# Patient Record
Sex: Female | Born: 1955 | Race: White | Hispanic: No | Marital: Married | Smoking: Never smoker
Health system: Southern US, Community
[De-identification: ages and names within clinical notes are randomized; demographics above are authoritative.]

## PROBLEM LIST (undated history)

## (undated) DIAGNOSIS — K8050 Calculus of bile duct without cholangitis or cholecystitis without obstruction: Secondary | ICD-10-CM

## (undated) DIAGNOSIS — K851 Biliary acute pancreatitis without necrosis or infection: Secondary | ICD-10-CM

## (undated) DIAGNOSIS — R0789 Other chest pain: Secondary | ICD-10-CM

## (undated) HISTORY — DX: Other chest pain: R07.89

## (undated) HISTORY — DX: Biliary acute pancreatitis without necrosis or infection: K85.10

## (undated) HISTORY — DX: Calculus of bile duct without cholangitis or cholecystitis without obstruction: K80.50

## (undated) HISTORY — PX: APPENDECTOMY: SHX54

---

## 2008-04-17 ENCOUNTER — Encounter: Admission: RE | Admit: 2008-04-17 | Discharge: 2008-04-17 | Payer: Self-pay | Admitting: Gastroenterology

## 2008-08-24 ENCOUNTER — Inpatient Hospital Stay (HOSPITAL_COMMUNITY): Admission: EM | Admit: 2008-08-24 | Discharge: 2008-08-28 | Payer: Self-pay | Admitting: Emergency Medicine

## 2008-08-26 ENCOUNTER — Encounter (INDEPENDENT_AMBULATORY_CARE_PROVIDER_SITE_OTHER): Payer: Self-pay | Admitting: General Surgery

## 2008-08-26 HISTORY — PX: LAPAROSCOPIC CHOLECYSTECTOMY W/ CHOLANGIOGRAPHY: SUR757

## 2011-05-11 NOTE — Assessment & Plan Note (Signed)
[facility]                                 ON-CALL NOTE   NAME:Meldrum, Karon                           MRN:          [id number]  DATE:08/24/2008                            DOB:          Oct 08, 1956    TELEPHONE NUMBER:  [phone].   PHYSICIAN:  Dr. Charna Elizabeth   Mrs. Creighton called the answering service at 6:30 am reporting presistent,  severe right upper quadrant abdominal pain associated with nausea and 1  episode of vomiting beginning 6 hours ago.  She has a history of  gallstones and has had mild symptoms in the past.  Over the past week,  she has had worsening symptoms: more frequent, more painful and more  prolonged attacks.  She has had the most severe attack of pain that she  has ever had that began about 12:30 am.  I advised her to have her  husband bring her to the emergency room and to not have anything to eat  or drink.  She will be seen in [facility] Emergency Room for  further evaluation and management.  The patient is agreeable with this  plan.     Venita Lick. Russella Dar, MD, [facility]  Electronically Signed    MTS/MedQ  DD: 08/24/2008  DT: 08/24/2008  Job #: [id number]   cc:   Anselmo Rod, M.D.

## 2011-05-11 NOTE — Discharge Summary (Signed)
Ann Walker, Ann Walker                  ACCOUNT NO.:  [id number]   MEDICAL RECORD NO.:  [id number]          PATIENT TYPE:  INP   LOCATION:  5158                         FACILITY:  [facility]   PHYSICIAN:  Adolph Pollack, M.D.DATE OF BIRTH:  10-15-1956   DATE OF ADMISSION:  08/24/2008  DATE OF DISCHARGE:  08/28/2008                               DISCHARGE SUMMARY   ADMITTING PHYSICIAN:  Juanetta Gosling, MD   DISCHARGING PHYSICIAN:  Adolph Pollack, MD   PROCEDURES:  1. Laparoscopic cholecystectomy with intraoperative cholangiogram done      on August 26, 2008, by Dr. Abbey Chatters.  2. Endoscopic retrograde cholangiopancreatography by Dr. Bosie Clos.   CONSULTANT:  Shirley Friar, MD, with [facility].   REASON FOR ADMISSION:  Ann Walker is a 55 year old female who presented  with a chief complaint of abdominal pain on August 24, 2008.  She was  diagnosed with gallstones this past April but decided to try to treat  this with her diet.  However, this did fail and last week, she began to  have an increasing abdominal pain.  At this time, she called our  practice and was set up for surgery for the middle of September;  however, she presented to the emergency department today with persistent  abdominal pain radiating to her back.  At this time, she was admitted to  go ahead and proceed with a laparoscopic cholecystectomy.  Please see  admitting history and physical for further details.   ADMITTING DIAGNOSES:  1. Choledocholithiasis.  2. Gallstone pancreatitis.   HOSPITAL COURSE:  Upon presentation to the emergency department, the  patient's lipase was found to be 3899 with all other liver function  elevated as well.  Total bilirubin at this time was 2.2.  At this time,  the patient was admitted for gallstone pancreatitis and was kept n.p.o.  for the next couple of days until all of her lab values began to  decrease prior to removing her gallbladder.  By hospital day #2, the  patient's lipase had decreased to 78, as well as all of her other LFTs  were trending down as well.  Therefore at this time, the patient was  taken to the operating room where a laparoscopic cholecystectomy with  intraoperative cholangiogram was performed.  While in this procedure and  having the IOC done, it was found that the patient had more gallstones  in her common bile duct.  Therefore after the procedure, [facility] was called, and on postoperative day #1, the patient  had an ERCP performed, we then removed multiple gallstones, and did a  sphincterotomy.  By postoperative day 2, the patient was feeling better  with minimal pain and at this time, tolerating a diet.  A drain was  placed during the laparoscopic cholecystectomy.  At this time, the only  thing that remains in this drain is serosanguineous fluid and no  evidence of bilious fluid.  Therefore at this time before the patient  was discharged, her JP drain was removed.   DISCHARGE DIAGNOSES:  1. Choledocholithiasis.  2. Biliary pancreatitis.  3. Status post laparoscopic cholecystectomy with intraoperative      cholangiogram.  4. Status post endoscopic retrograde cholangiopancreatography.   DISCHARGE MEDICATIONS:  The patient was not on any home medications;  however, she has been given a prescription for Vicodin 5/325 one to two  tablets q.4 h. p.r.n. for pain.  She is also informed that she may take  a laxative of choice if needed.   DISCHARGE INSTRUCTIONS:  The patient is informed that she is not to do  any heavy lifting for the next 2 weeks of anything greater than  approximately 10-15 pounds to prevent incisional hernia.  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  The patient is informed at  this time that she may increase her activity slowly and may walk up  steps.  She is also informed that she may shower; however,  she is not to  bathe for the next 2 weeks.  Otherwise, the patient is informed that if  she begins to get a fever greater than 101.5, new or worsening abdominal  pain or redness or pus-like drainage coming from her incision, she is to  call our office to be seen before her appointment.  Otherwise, she does  have a followup appointment with [facility] on September 10, 2008, at  2:45 p.m. and she is informed that she needs to arrive at 2:30.      Ann Cape, PA      Adolph Pollack, M.D.  Electronically Signed    KEO/MEDQ  D:  08/28/2008  T:  08/28/2008  Job:  [id number]   cc:   Shirley Friar, MD

## 2011-05-11 NOTE — Consult Note (Signed)
Ann Walker, Ann Walker                  ACCOUNT NO.:  [id number]   MEDICAL RECORD NO.:  [id number]          PATIENT TYPE:  INP   LOCATION:  5158                         FACILITY:  [facility]   PHYSICIAN:  Shirley Friar, MDDATE OF BIRTH:  [birth year]   DATE OF CONSULTATION:  DATE OF DISCHARGE:                                 CONSULTATION   REQUESTING PHYSICIAN:  Dione Booze, MD   REASON FOR CONSULTATION:  Gallstone pancreatitis.   HISTORY OF PRESENT ILLNESS:  Ann Walker is a 55 year old white female  previously unassigned to our group was being seen at the request of Dr.  Preston Fleeting and Dr. Dwain Sarna.  Ann Walker has known gallstones seen on  ultrasound in April 2009, who presents with 1 week of postprandial right  upper quadrant pain.  She had an episode last night that was described  as sharp, constant, lasting for over 6 hours in the right upper  quadrant.  She had episode of vomiting in the emergency room.  She  denies any fevers or chills.  She was afebrile on presentation with  normal white blood count of 7.6.  Her lipase was noted to be 3899, T-  bili 2.2, ALP 242, AST 641, and ALT 498.  Ultrasound preliminary showed  no acute cholecystitis and no CBD dilation, did show gallstones and  fatty liver.   PAST MEDICAL HISTORY:  Negative except as stated above.   MEDICATIONS:  None.   ALLERGIES:  No known drug allergies.   FAMILY HISTORY:  Noncontributory.   SOCIAL HISTORY:  Denies cigarette, alcohol, or drugs.   REVIEW OF SYSTEMS:  Negative for upper GI standpoint except as stated  above.   PHYSICAL EXAMINATION:  VITAL SIGNS:  Temperature 98.1, pulse 80, blood  pressure 144/79, and O2 sat 98% on room air.  GENERAL:  Alert, obese.  No acute distress.  ABDOMEN:  Right upper quadrant tenderness with minimal guarding, soft,  nondistended, positive bowel sounds.   LABORATORY DATA:  White blood count 7.6, hemoglobin 13.5, and platelet  count 185.  Lipase 3899, T-bili 2.2, ALP 242, AST  641, ALT 498, and  albumin 3.8.   IMPRESSION:  A 55 year old white female with gallstone pancreatitis  likely due to passage of a stone versus possible small retained stone.  There is no ultrasound evidence of common bile duct obstruction.  There  is no acute indication for endoscopic retrograde  cholangiopancreatography.  I agree with n.p.o. status for bowel rest and  IV fluids.  We will need to follow her labs closely and if her liver  function test did not improve then she may need to  have a preop endoscopic retrograde cholangiopancreatography.  Otherwise,  we would likely recommend cholecystectomy with intraoperative  cholangiogram.  We will follow along closely with you.  Thank for this  consultation.      Shirley Friar, MD  Electronically Signed     VCS/MEDQ  D:  08/24/2008  T:  08/25/2008  Job:  [id number]   cc:   Juanetta Gosling, MD

## 2011-05-11 NOTE — Op Note (Signed)
Ann Walker, Ann Walker                  ACCOUNT NO.:  [id number]   MEDICAL RECORD NO.:  [id number]          PATIENT TYPE:  INP   LOCATION:  5158                         FACILITY:  [facility]   PHYSICIAN:  John C. Madilyn Fireman, M.D.    DATE OF BIRTH:  11/05/1956   DATE OF PROCEDURE:  08/27/2008  DATE OF DISCHARGE:                               OPERATIVE REPORT   PROCEDURE:  Endoscopic retrograde cholangiopancreatography with  sphincterotomy and stone extraction.   INDICATIONS FOR PROCEDURE:  Common bile duct stones on intraoperative  cholangiogram.   DESCRIPTION OF PROCEDURE:  The patient was placed in the prone position  and placed on the pulse monitor with continuous low-flow oxygen  delivered by nasal cannula.  She was sedated with 75 mcg IV fentanyl and  6 mg IV Versed, and 12.5 mg of Phenergan.  The Olympus side-viewing  endoscope was advanced blindly into the oropharynx and esophagus.  The  pylorus was traversed and the papilla of Vater located on the medial  duodenal wall, had a generous intramural segment.  Deep cannulation was  achieved with the Wilson-Cook sphincterotome and a cholangiogram was  obtained.  This showed multiple filling defects.  No pancreatogram was  obtained.  The guidewire was advanced into the common hepatic duct and a  large sphincterotomy was made.  Then using an adjustable balloon  catheter to 18 mm, 5 multifaceted stones were removed on several sweeps,  each approximately 4-5 mm in diameter.  After the last stone was  delivered, an occlusion cholangiogram revealed no further filling  defects.  The scope was then withdrawn, and there was good drainage at  the end of the procedure.  The patient was returned to the recovery room  in stable condition.  She tolerated the procedure well and there were no  immediate complications.   IMPRESSION:  Five common bile duct stones removed after sphincterotomy.   PLAN:  Observe overnight for complications and possibly discharge  tomorrow.           ______________________________  Everardo All Madilyn Fireman, M.D.     JCH/MEDQ  D:  08/27/2008  T:  08/28/2008  Job:  [id number]   cc:   Adolph Pollack, M.D.

## 2011-05-11 NOTE — Op Note (Signed)
NAMEAALIYAN, BRINKMEIER                  ACCOUNT NO.:  [id number]   MEDICAL RECORD NO.:  [id number]          PATIENT TYPE:  INP   LOCATION:  5158                         FACILITY:  [facility]   PHYSICIAN:  Adolph Pollack, M.D.DATE OF BIRTH:  1956-12-16   DATE OF PROCEDURE:  08/26/2008  DATE OF DISCHARGE:                               OPERATIVE REPORT   PREOPERATIVE DIAGNOSIS:  Biliary pancreatitis.   POSTOPERATIVE DIAGNOSES:  Biliary pancreatitis and choledocholithiasis.   PROCEDURE:  Laparoscopic cholecystectomy with intraoperative  cholangiogram.   SURGEON:  Adolph Pollack, MD   ASSISTANT:  Ardeth Sportsman, MD   ANESTHESIA:  General.   INDICATIONS:  This 55 year old female was admitted to the hospital on  August 24, 2008, with biliary pancreatitis.  She has gallstones and  elevation of lipase and liver functions.  The liver functions and lipase  have come down significantly and she is currently pain-free.  She now  presents for cholecystectomy.   TECHNIQUE:  She was brought to the operating room, placed supine on the  operating table and general anesthetic was administered.  Abdominal wall  was then sterilely prepped and draped.  Marcaine was infiltrated in the  subumbilical region.  A subumbilical incision was made through the skin,  subcutaneous tissue, fascia, and peritoneum entering the peritoneal  cavity under direct vision.  A pursestring suture of 0 Vicryl was placed  around the fascial edges.  A Hassan trocar was introduced into the  peritoneal cavity.  Pneumoperitoneum was created by insufflation of CO2  gas.   Laparoscope was then introduced and she was placed in the reverse  Trendelenburg position with the right side tilted slightly up.  An 11-mm  trocar was placed to epigastric incision and two 5-mm trocars placed in  the right upper quadrant.  The fundus of the gallbladder was grasped and  retracted toward the right shoulder.  No acute inflammatory change was  noted.  The infundibulum was grasped and retracted laterally.  Using  blunt dissection the infundibulum was mobilized.  I then isolated the  cystic duct and created a window around it.  A clip was placed in the  cystic duct gallbladder junction.  A small incision was made in the  cystic duct and some bile under pressure was noted to come out of the  cystic duct.  I then placed a cholangiocatheter through the anterior  abdominal wall into the cystic duct and performed a cholangiogram.   Under real time fluoroscopy, dilute contrast was injected to the cystic  duct, which was of moderate length.  The common hepatic, left and right  hepatic, and common bile ducts filled.  The multiple irregularities and  some filling defects on real time fluoroscopy.  There was one persistent  filling defect near the distal bile duct consistent with her common duct  stone.  There was some contrast that was able to pass into the duodenum.   Following this, I removed the cholangiocatheter.  I clipped the cystic  duct 3 times on the biliary side and divided.  I then identified the  cystic  artery and created a window around it.  It was clipped and  divided.  I then dissected the gallbladder free from the liver using  electrocautery and placed the gallbladder and the Endopouch bag.  Following this, I copiously irrigated out the gallbladder fossa and  controlled bleeding points with electrocautery.  Because there appeared  to be some back pressure in the biliary system, I opened up the cystic  duct.  I decided to leave a drain in.  I placed a 19 Blake drain into  the peritoneal cavity and had an exit out the lateral 5-mm trocar site  and was anchored to the skin with nylon suture.  I then evacuated the  remaining fluid from the abdominal cavity as much as possible,  reinspected the liver bed which was hemostatic without evidence of bile  leak.  The gallbladder was then removed through the subumbilical port  and  the subumbilical fascial defect closed by tightening up and tying  down the pursestring suture.  The remaining trocars removed and  pneumoperitoneum was released.   Skin incisions were closed with 4-0 Monocryl subcuticular stitches.  Steri-Strips and sterile dressings were applied.   She tolerated the procedure without any apparent complications and was  taken to the recovery room in satisfactory condition.      Adolph Pollack, M.D.  Electronically Signed     TJR/MEDQ  D:  08/26/2008  T:  08/27/2008  Job:  [id number]

## 2011-05-11 NOTE — H&P (Signed)
NAMESALEAH, RISHEL                  ACCOUNT NO.:  [id number]   MEDICAL RECORD NO.:  [id number]          PATIENT TYPE:  INP   LOCATION:  5158                         FACILITY:  [facility]   PHYSICIAN:  Juanetta Gosling, MDDATE OF BIRTH:  1956/12/03   DATE OF ADMISSION:  08/24/2008  DATE OF DISCHARGE:                              HISTORY & PHYSICAL   CHIEF COMPLAINT:  Abdominal pain.   PRESENT ILLNESS:  A 55 year old female who presents with chief complaint  of abdominal pain.  She began diagnosis with gallstones in April, but  she decided to try to treat this with the diet.  Over the last week, she  has been having increasing problems and she was scheduled to see the  surgery practice in the middle of September.  She now has persistent  abdominal pain radiating to her back, described as cramping and dull.  When I see her, her symptoms have resolved after having pain medication.   PAST MEDICAL HISTORY:  Negative.   PAST SURGICAL HISTORY:  Negative.   SOCIAL HISTORY:  Nonsmoker.  No alcohol.   ALLERGIES:  No known drug allergies.   Not taking medications.   REVIEW OF SYSTEMS:  Negative.   PHYSICAL EXAMINATION:  VITAL SIGNS:  Temperature of 91, pulse of 80,  respirations 20, and blood pressure 144/79.  GENERAL:  Well developed and well nourished.  HEENT:  She was a normocephalic and atraumatic.  NECK:  Supple without adenopathy.  CARDIOVASCULAR:  Regular rate and rhythm without murmurs, rubs, or  gallops.  RESPIRATORY:  Clear breath sounds bilaterally.  ABDOMEN:  Soft.  Mild right upper quadrant tenderness and nondistended.  No masses, no hepatosplenomegaly, and she has no CVA tenderness.   LABORATORY EXAMINATION:  Lipase of 3899, BUN and creatinine of 7 and  0.78.  Her albumin is 3.8.  Her AST is 641, ALT 498, and alk phos is  242, and bilirubin is 2.2.  white blood cell count is 7.6 and her  hematocrit is 39.9.  She also has a history of a colonoscopy that was  with a small  polyp several months ago, which is otherwise normal.  UA  shows small leukocyte esterase.  Her urine pregnancy test is negative  for ultrasound from April shows cholelithiasis.   ASSESSMENT:  Choledocholithiasis and gallstone pancreatitis.   PLAN:  Redo ultrasound to see if her ducts are dilated all.  GI consult,  recheck her labs today, NPO, admit.  She  will eventually need a  LAP  CHOLE for after either she has passed a stone or had an ERCP.      Juanetta Gosling, MD  Electronically Signed     MCW/MEDQ  D:  08/24/2008  T:  08/25/2008  Job:  [id number]

## 2011-09-29 LAB — TYPE AND SCREEN
ABO/RH(D): B POS
Antibody Screen: NEGATIVE

## 2011-09-29 LAB — COMPREHENSIVE METABOLIC PANEL
CO2: 24
Calcium: 8.3 — ABNORMAL LOW
Creatinine, Ser: 0.71
GFR calc Af Amer: >60
GFR calc non Af Amer: >60
Glucose, Bld: 135 — ABNORMAL HIGH

## 2011-09-29 LAB — CBC
Hemoglobin: 12.7
MCHC: 34.5
MCV: 89.1
RBC: 4.12

## 2011-09-29 LAB — ABO/RH: ABO/RH(D): B POS

## 2012-03-16 ENCOUNTER — Emergency Department: Payer: Self-pay | Admitting: Emergency Medicine

## 2012-03-16 LAB — BASIC METABOLIC PANEL
Anion Gap: 12 (ref 7–16)
BUN: 12 mg/dL (ref 7–18)
Calcium, Total: 8.8 mg/dL (ref 8.5–10.1)
Chloride: 106 mmol/L (ref 98–107)
Co2: 25 mmol/L (ref 21–32)
Creatinine: 0.74 mg/dL (ref 0.60–1.30)
Osmolality: 284 (ref 275–301)
Potassium: 4.4 mmol/L (ref 3.5–5.1)
Sodium: 143 mmol/L (ref 136–145)

## 2012-03-16 LAB — CBC
HCT: 39 % (ref 35.0–47.0)
HGB: 13.1 g/dL (ref 12.0–16.0)
MCHC: 33.5 g/dL (ref 32.0–36.0)
MCV: 83 fL (ref 80–100)
RDW: 13.8 % (ref 11.5–14.5)

## 2012-03-22 ENCOUNTER — Encounter: Payer: Self-pay | Admitting: *Deleted

## 2012-03-23 ENCOUNTER — Encounter: Payer: Self-pay | Admitting: Nurse Practitioner

## 2012-03-23 ENCOUNTER — Ambulatory Visit (INDEPENDENT_AMBULATORY_CARE_PROVIDER_SITE_OTHER): Payer: 59 | Admitting: Nurse Practitioner

## 2012-03-23 VITALS — BP 120/80 | HR 88 | Ht 63.0 in | Wt 195.0 lb

## 2012-03-23 DIAGNOSIS — R0789 Other chest pain: Secondary | ICD-10-CM

## 2012-03-23 DIAGNOSIS — R072 Precordial pain: Secondary | ICD-10-CM

## 2012-03-23 NOTE — Progress Notes (Signed)
Exercise Treadmill Test  Pre-Exercise Testing Evaluation Rhythm: normal sinus  Rate: 90  PR:  .16 QRS:  .06  QT:  .34 QTc: .41   P axis:   QRS axis:    ST Segments:  no significant ST changes at rest     Test  Exercise Tolerance Test Ordering MD: Mariah Milling  Interpreting MD:  Brion Aliment, NP  Unique Test No:   Treadmill:  1  Indication for ETT: chest pain - rule out ischemia  Contraindication to ETT: No   Stress Modality: exercise - treadmill  Cardiac Imaging Performed: non   Protocol: standard Bruce - maximal  Max BP:  150/80 w/ exercise/ 160/76 in rec.  Max MPHR (bpm):  167 85% MPR (bpm):  142  MPHR obtained (bpm):  152 % MPHR obtained:  91%  Reached 85% MPHR (min:sec):  4:30 Total Exercise Time (min-sec):  5:00  Workload in METS:  7.0 Borg Scale: n/a  Reason ETT Terminated:  dyspnea    ST Segment Analysis At Rest: normal ST segments - no evidence of significant ST depression With Exercise: no evidence of significant ST depression  Other Information Arrhythmia:  No Angina during ETT:  absent (0) Quality of ETT:  diagnostic  ETT Interpretation:  normal - no evidence of ischemia by ST analysis  Comments: Test stopped secondary to dyspnea and leg fatigue @ 5:00.  No chest pain.  Recommendations: No further cardiac work-up.  Follow up with PCP for any additional evaluation of non-cardiac chest pain.

## 2012-03-27 ENCOUNTER — Encounter: Payer: Self-pay | Admitting: Nurse Practitioner

## 2012-05-17 ENCOUNTER — Emergency Department: Payer: Self-pay | Admitting: *Deleted

## 2012-05-17 LAB — CBC
HGB: 12.3 g/dL (ref 12.0–16.0)
MCH: 27.4 pg (ref 26.0–34.0)
MCHC: 32.9 g/dL (ref 32.0–36.0)
MCV: 83 fL (ref 80–100)
Platelet: 204 10*3/uL (ref 150–440)
RDW: 14.5 % (ref 11.5–14.5)
WBC: 7.6 10*3/uL (ref 3.6–11.0)

## 2012-05-17 LAB — URINALYSIS, COMPLETE
Bacteria: NONE SEEN
Blood: NEGATIVE
Ketone: NEGATIVE
Nitrite: NEGATIVE
Ph: 6 (ref 4.5–8.0)
RBC,UR: 1 /HPF (ref 0–5)
Squamous Epithelial: 1

## 2012-05-17 LAB — BASIC METABOLIC PANEL
Chloride: 99 mmol/L (ref 98–107)
Creatinine: 0.92 mg/dL (ref 0.60–1.30)
Glucose: 103 mg/dL — ABNORMAL HIGH (ref 65–99)
Potassium: 3.8 mmol/L (ref 3.5–5.1)
Sodium: 133 mmol/L — ABNORMAL LOW (ref 136–145)

## 2012-05-17 LAB — CK TOTAL AND CKMB (NOT AT ARMC): CK-MB: 1.8 ng/mL (ref 0.5–3.6)

## 2012-05-17 LAB — TROPONIN I: Troponin-I: <0.02 ng/mL

## 2012-05-19 LAB — LIPID PANEL
CHOLESTEROL: 190 mg/dL (ref 0–200)
HDL: 52 mg/dL (ref 35–70)
LDL Cholesterol: 113 mg/dL
TRIGLYCERIDES: 126 mg/dL (ref 40–160)

## 2012-05-23 ENCOUNTER — Ambulatory Visit: Payer: Self-pay | Admitting: Internal Medicine

## 2012-12-10 ENCOUNTER — Ambulatory Visit: Payer: Self-pay

## 2012-12-27 HISTORY — PX: COLONOSCOPY: SHX174

## 2013-04-07 ENCOUNTER — Ambulatory Visit: Payer: Self-pay

## 2013-06-03 ENCOUNTER — Ambulatory Visit: Payer: Self-pay | Admitting: Internal Medicine

## 2013-06-03 LAB — CBC
MCHC: 34.7 g/dL (ref 32.0–36.0)
RDW: 13.6 % (ref 11.5–14.5)

## 2013-06-03 LAB — COMPREHENSIVE METABOLIC PANEL
Anion Gap: 5 — ABNORMAL LOW (ref 7–16)
BUN: 8 mg/dL (ref 7–18)
Bilirubin,Total: 0.7 mg/dL (ref 0.2–1.0)
Chloride: 108 mmol/L — ABNORMAL HIGH (ref 98–107)
EGFR (African American): >60
EGFR (Non-African Amer.): >60
Potassium: 3.7 mmol/L (ref 3.5–5.1)
Total Protein: 7.1 g/dL (ref 6.4–8.2)

## 2013-06-03 LAB — URINALYSIS, COMPLETE
Bilirubin,UR: NEGATIVE
Protein: NEGATIVE
Squamous Epithelial: <1
WBC UR: <1 /HPF (ref 0–5)

## 2013-06-04 ENCOUNTER — Inpatient Hospital Stay: Payer: Self-pay | Admitting: Surgery

## 2013-06-06 LAB — CBC WITH DIFFERENTIAL/PLATELET
Basophil #: 0 10*3/uL (ref 0.0–0.1)
HCT: 33.2 % — ABNORMAL LOW (ref 35.0–47.0)
HGB: 11.6 g/dL — ABNORMAL LOW (ref 12.0–16.0)
Lymphocyte #: 0.7 10*3/uL — ABNORMAL LOW (ref 1.0–3.6)
MCH: 29.6 pg (ref 26.0–34.0)
Monocyte #: 0.4 x10 3/mm (ref 0.2–0.9)
Platelet: 137 10*3/uL — ABNORMAL LOW (ref 150–440)

## 2013-08-13 ENCOUNTER — Ambulatory Visit: Payer: Self-pay | Admitting: Gastroenterology

## 2013-11-14 LAB — HM MAMMOGRAPHY: HM Mammogram: NORMAL

## 2014-06-16 ENCOUNTER — Emergency Department: Payer: Self-pay | Admitting: Emergency Medicine

## 2014-06-16 LAB — URINALYSIS, COMPLETE
BILIRUBIN, UR: NEGATIVE
Bacteria: NONE SEEN
Blood: NEGATIVE
GLUCOSE, UR: NEGATIVE mg/dL (ref 0–75)
KETONE: NEGATIVE
Leukocyte Esterase: NEGATIVE
NITRITE: NEGATIVE
PROTEIN: NEGATIVE
Ph: 6 (ref 4.5–8.0)
RBC, UR: NONE SEEN /HPF (ref 0–5)
SPECIFIC GRAVITY: 1.001 (ref 1.003–1.030)
Squamous Epithelial: <1

## 2014-06-16 LAB — BASIC METABOLIC PANEL
Anion Gap: 10 (ref 7–16)
BUN: 10 mg/dL (ref 7–18)
CALCIUM: 8.9 mg/dL (ref 8.5–10.1)
CHLORIDE: 102 mmol/L (ref 98–107)
CO2: 25 mmol/L (ref 21–32)
Creatinine: 0.84 mg/dL (ref 0.60–1.30)
GLUCOSE: 109 mg/dL — AB (ref 65–99)
Osmolality: 273 (ref 275–301)
POTASSIUM: 3.3 mmol/L — AB (ref 3.5–5.1)
SODIUM: 137 mmol/L (ref 136–145)

## 2014-06-16 LAB — TROPONIN I

## 2015-02-01 ENCOUNTER — Emergency Department: Payer: Self-pay | Admitting: Emergency Medicine

## 2015-02-01 LAB — CBC
HCT: 43.4 % (ref 35.0–47.0)
HGB: 14.8 g/dL (ref 12.0–16.0)
MCH: 29.6 pg (ref 26.0–34.0)
MCHC: 34 g/dL (ref 32.0–36.0)
MCV: 87 fL (ref 80–100)
PLATELETS: 197 10*3/uL (ref 150–440)
RBC: 4.99 10*6/uL (ref 3.80–5.20)
RDW: 12.2 % (ref 11.5–14.5)
WBC: 4.6 10*3/uL (ref 3.6–11.0)

## 2015-02-01 LAB — COMPREHENSIVE METABOLIC PANEL
ALBUMIN: 3.7 g/dL (ref 3.4–5.0)
ALK PHOS: 92 U/L (ref 46–116)
ALT: 44 U/L (ref 14–63)
AST: 31 U/L (ref 15–37)
Anion Gap: 8 (ref 7–16)
BUN: 6 mg/dL — AB (ref 7–18)
Bilirubin,Total: 0.8 mg/dL (ref 0.2–1.0)
CALCIUM: 8.7 mg/dL (ref 8.5–10.1)
Chloride: 106 mmol/L (ref 98–107)
Co2: 27 mmol/L (ref 21–32)
Creatinine: 0.86 mg/dL (ref 0.60–1.30)
EGFR (African American): >60
EGFR (Non-African Amer.): >60
GLUCOSE: 97 mg/dL (ref 65–99)
Osmolality: 279 (ref 275–301)
POTASSIUM: 3.7 mmol/L (ref 3.5–5.1)
Sodium: 141 mmol/L (ref 136–145)
TOTAL PROTEIN: 6.9 g/dL (ref 6.4–8.2)

## 2015-02-01 LAB — URINALYSIS, COMPLETE
Bacteria: NONE SEEN
Bilirubin,UR: NEGATIVE
Blood: NEGATIVE
GLUCOSE, UR: NEGATIVE mg/dL (ref 0–75)
Ketone: NEGATIVE
NITRITE: NEGATIVE
PH: 7 (ref 4.5–8.0)
PROTEIN: NEGATIVE
RBC, UR: NONE SEEN /HPF (ref 0–5)
Specific Gravity: 1.002 (ref 1.003–1.030)
Squamous Epithelial: <1
WBC UR: <1 /HPF (ref 0–5)

## 2015-02-01 LAB — TROPONIN I: Troponin-I: <0.02 ng/mL

## 2015-04-18 NOTE — Discharge Summary (Signed)
PATIENT NAME:  Ann Walker, Ann Walker MR#:  [id number] DATE OF BIRTH:  [birth year]  DATE OF ADMISSION:  06/04/2013 DATE OF DISCHARGE:  06/06/2013  DIAGNOSES:  Acute appendicitis.   PROCEDURE:  Laparoscopic appendectomy.   HISTORY OF PRESENT ILLNESS AND HOSPITAL COURSE:  This is a patient with progressive right lower quadrant pain and tenderness with signs of peritoneal irritation who was taken to the operating room for a diagnosis of acute appendicitis, which was confirmed, had laparoscopy and laparoscopic appendectomy was performed.  She made an uncomplicated postoperative recovery and was discharged in stable condition on oral analgesics to follow up in my office in 10 days with instructions to shower as needed and to not lift any heavy items until seen in the office.    ____________________________ Adah Salvageichard E. Excell Seltzerooper, MD rec:ea D: 06/09/2013 11:24:35 ET T: 06/10/2013 03:54:33 ET JOB#: [id number]  cc: Adah Salvageichard E. Excell Seltzerooper, MD, <Dictator> Lattie HawICHARD E Blessin Kanno MD ELECTRONICALLY SIGNED 06/10/2013 11:40

## 2015-04-18 NOTE — Op Note (Signed)
PATIENT NAME:  Ann Walker, Ann Walker MR#:  [id number] DATE OF BIRTH:  [birth year]  DATE OF PROCEDURE:  06/04/2013  PREOPERATIVE DIAGNOSIS: Acute appendicitis.   POSTOPERATIVE DIAGNOSIS: Acute appendicitis.   PROCEDURE: Laparoscopic appendectomy.   SURGEON: Dionne Miloichard Shirah Roseman, M.D.   ANESTHESIA: General, with endotracheal tube.   INDICATIONS: This is a patient with progressive right lower quadrant pain and tenderness, with signs of leukocytosis, peritoneal irritation, and CT findings suggestive of appendicitis. Preoperatively we discussed the rationale for surgery, the options of observation, risks of bleeding, infection, recurrence, negative laparoscopy and further infection. She understood and agreed to proceed. This was reviewed for her in the preop holding area.   FINDINGS: Contained perforation of a ruptured appendix, with less than 1 mL of purulence surrounding the appendix contained by omentum.   DESCRIPTION OF PROCEDURE: The patient was induced to general anesthesia. She was given IV antibiotics. VTE prophylaxis was in place and a Foley catheter was in place. She was then prepped and draped in a sterile fashion. Marcaine was infiltrated in skin and subcutaneous tissues around the periumbilical area. An incision was made. Veress needle was placed. Pneumoperitoneum was obtained and a 5 mm trocar port was placed. The abdominal cavity was explored and under direct vision a 12 mm left lateral port was placed, followed by a 5 mm suprapubic port. The appendix was identified in the right lower quadrant, encased in omentum, peeling back to the omentum a very minimal amount of purulence was noted at an area of necrosis in the midsection of the appendix. This was aspirated and was limited to less than 1 mL.   The base of the appendix was divided with a standard-load Endo GIA, and then 3 firings of the vascular-load Endo GIA was fired across the mesoappendix, and the specimen was passed out through the lateral port  site with the aid of an EndoCatch bag. The area was checked for hemostasis, irrigated with copious amounts of normal saline. There was no further purulence or abscess, and once irrigation was completed and aspiration was performed there was no sign of bleeding.   Under direct vision, the left lateral port site was closed with multiple simple sutures of 0 Vicryl utilizing an Endo Close technique. Again, the hemostasis was found to be adequate, therefore pneumoperitoneum was released. All ports were removed; 4-0 subcuticular Monocryl was used on all skin edges. Steri-Strips, Mastisol, and sterile dressings were placed.   The patient tolerated the procedure well. There were no complications. She was taken to the recovery room in stable condition to be admitted for continued care.     ____________________________ Adah Salvageichard E. Excell Seltzerooper, MD rec:dm D: 06/04/2013 08:54:45 ET T: 06/04/2013 09:12:08 ET JOB#: [id number]  cc: Adah Salvageichard E. Excell Seltzerooper, MD, <Dictator> Lattie HawICHARD E Beryl Balz MD ELECTRONICALLY SIGNED 06/04/2013 9:49

## 2015-04-18 NOTE — H&P (Signed)
   Subjective/Chief Complaint RLQ pain x 1.5 days, nausea/emesis, anorexia   History of Present Illness [name] is a pleasant 59 yo F with a PMH of gallstone pancreatitis s/p cholecystectomy who presents with 1.5 days of RLQ pain.  She says that it began acutely 6/7 in the am.  It was periumbilical initially and now focal RLQ.  Also with poor appetite and small episode of emesis.  Not improving.  Worse with movement, better with being still.  No fevers, chills, night sweats, shortness of breath, chest pain, cough, dysuria/hematuria.   Past History Gallstone pancreatitis s/p lap cholecystectomy ?ERCP   Past Med/Surgical Hx:  Denies medical history:   Cholecystectomy:   ALLERGIES:  No Known Allergies:   Family and Social History:  Family History Negative   Social History negative tobacco, negative ETOH, negative Illicit drugs   Place of Living Home  [place] [address] [place], + children, husband currently [place]   Review of Systems:  Subjective/Chief Complaint RLQ pain, nausea/emesis, anorexia   Fever/Chills No   Cough No   Sputum No   Abdominal Pain Yes   Diarrhea No   Constipation No   Nausea/Vomiting Yes  Small episode   SOB/DOE No   Chest Pain No   Dysuria No   Physical Exam:  GEN well developed, well nourished, no acute distress   HEENT pale conjunctivae, PERRL, hearing intact to voice, good dentition   RESP normal resp effort  clear BS  no use of accessory muscles   CARD regular rate  no murmur  no thrills   ABD positive tenderness  no hernia  soft  hypoactive BS   EXTR negative cyanosis/clubbing, negative edema   SKIN normal to palpation, No rashes, No ulcers, skin turgor good   NEURO negative rigidity, negative tremor, follows commands, strength:, motor/sensory function intact   PSYCH A+O to time, place, person, good insight    Assessment/Admission Diagnosis [name] is a pleasant 59 yo F with 1 day of RLQ pain, anorexia,  nausea/vomiting.  Tender over McBurney's point.  WBC 10.  CT shows dilated appendix with fecolith at base and adjacent inflammation.  Clinically and radiographically consistent with acute appendicitis.   Plan Will admit for resuscitation and appendectomy in am when resuscitated.   Electronic Signatures: Jarvis NewcomerLundquist, Jaymar Loeber A (MD)  (Signed 09-Jun-14 01:23)  Authored: CHIEF COMPLAINT and HISTORY, PAST MEDICAL/SURGIAL HISTORY, ALLERGIES, FAMILY AND SOCIAL HISTORY, REVIEW OF SYSTEMS, PHYSICAL EXAM, ASSESSMENT AND PLAN   Last Updated: 09-Jun-14 01:23 by Jarvis NewcomerLundquist, Monque Haggar A (MD)

## 2015-10-27 ENCOUNTER — Other Ambulatory Visit: Payer: Self-pay | Admitting: Internal Medicine

## 2015-10-27 ENCOUNTER — Encounter: Payer: Self-pay | Admitting: Internal Medicine

## 2015-10-27 DIAGNOSIS — N6019 Diffuse cystic mastopathy of unspecified breast: Secondary | ICD-10-CM | POA: Insufficient documentation

## 2015-10-27 DIAGNOSIS — Z86010 Personal history of colonic polyps: Secondary | ICD-10-CM | POA: Insufficient documentation

## 2015-10-27 DIAGNOSIS — Z8669 Personal history of other diseases of the nervous system and sense organs: Secondary | ICD-10-CM | POA: Insufficient documentation

## 2015-10-27 DIAGNOSIS — G43009 Migraine without aura, not intractable, without status migrainosus: Secondary | ICD-10-CM | POA: Insufficient documentation

## 2015-10-27 DIAGNOSIS — R030 Elevated blood-pressure reading, without diagnosis of hypertension: Secondary | ICD-10-CM | POA: Insufficient documentation

## 2015-10-27 DIAGNOSIS — S336XXA Sprain of sacroiliac joint, initial encounter: Secondary | ICD-10-CM | POA: Insufficient documentation

## 2017-06-28 ENCOUNTER — Other Ambulatory Visit: Payer: Self-pay | Admitting: Obstetrics and Gynecology

## 2017-06-28 DIAGNOSIS — Z1231 Encounter for screening mammogram for malignant neoplasm of breast: Secondary | ICD-10-CM

## 2017-07-20 ENCOUNTER — Ambulatory Visit
Admission: RE | Admit: 2017-07-20 | Discharge: 2017-07-20 | Disposition: A | Discharge status: Home / Self Care | Payer: BLUE CROSS/BLUE SHIELD | Source: Ambulatory Visit | Attending: Obstetrics and Gynecology | Admitting: Obstetrics and Gynecology

## 2017-07-20 DIAGNOSIS — Z1231 Encounter for screening mammogram for malignant neoplasm of breast: Secondary | ICD-10-CM | POA: Insufficient documentation

## 2017-07-25 ENCOUNTER — Other Ambulatory Visit: Payer: Self-pay | Admitting: *Deleted

## 2017-07-25 ENCOUNTER — Inpatient Hospital Stay
Admission: RE | Admit: 2017-07-25 | Discharge: 2017-07-25 | Disposition: A | Discharge status: Home / Self Care | Payer: Self-pay | Source: Ambulatory Visit | Attending: *Deleted | Admitting: *Deleted

## 2017-07-25 DIAGNOSIS — Z9289 Personal history of other medical treatment: Secondary | ICD-10-CM

## 2018-07-13 ENCOUNTER — Ambulatory Visit
Admission: EM | Admit: 2018-07-13 | Discharge: 2018-07-13 | Disposition: A | Discharge status: Home / Self Care | Payer: BLUE CROSS/BLUE SHIELD | Attending: Family Medicine | Admitting: Family Medicine

## 2018-07-13 ENCOUNTER — Telehealth: Payer: Self-pay

## 2018-07-13 ENCOUNTER — Ambulatory Visit: Payer: BLUE CROSS/BLUE SHIELD | Admitting: Internal Medicine

## 2018-07-13 DIAGNOSIS — J0100 Acute maxillary sinusitis, unspecified: Secondary | ICD-10-CM | POA: Diagnosis not present

## 2018-07-13 DIAGNOSIS — J020 Streptococcal pharyngitis: Secondary | ICD-10-CM | POA: Insufficient documentation

## 2018-07-13 DIAGNOSIS — Z7982 Long term (current) use of aspirin: Secondary | ICD-10-CM | POA: Insufficient documentation

## 2018-07-13 DIAGNOSIS — Z8249 Family history of ischemic heart disease and other diseases of the circulatory system: Secondary | ICD-10-CM | POA: Insufficient documentation

## 2018-07-13 DIAGNOSIS — R030 Elevated blood-pressure reading, without diagnosis of hypertension: Secondary | ICD-10-CM | POA: Insufficient documentation

## 2018-07-13 DIAGNOSIS — J029 Acute pharyngitis, unspecified: Secondary | ICD-10-CM | POA: Diagnosis present

## 2018-07-13 DIAGNOSIS — Z79899 Other long term (current) drug therapy: Secondary | ICD-10-CM | POA: Insufficient documentation

## 2018-07-13 LAB — RAPID STREP SCREEN (MED CTR MEBANE ONLY): STREPTOCOCCUS, GROUP A SCREEN (DIRECT): POSITIVE — AB

## 2018-07-13 MED ORDER — AMOXICILLIN-POT CLAVULANATE 875-125 MG PO TABS: 1.0000 | ORAL_TABLET | Freq: Two times a day (BID) | ORAL | 0 refills | Status: DC

## 2018-07-13 MED ORDER — AMOXICILLIN-POT CLAVULANATE 875-125 MG PO TABS: 1.0000 | ORAL_TABLET | Freq: Two times a day (BID) | ORAL | 0 refills | Status: AC

## 2018-07-13 NOTE — ED Triage Notes (Signed)
As per patient onset week sore throat, cough.

## 2018-07-13 NOTE — ED Provider Notes (Signed)
[facility] ____________________________________________  Time seen: Approximately 12:43 PM  I have reviewed the triage vital signs and the nursing notes.   HISTORY  Chief Complaint Sore Throat   HPI Ann Walker is a 62 y.o. female presenting for evaluation of 1 week of nasal congestion, nasal drainage, sore throat and cough.  States that sore throat was initially mild and then worsened, but overall feels like sore throat is also starting to improve.  States continues with postnasal drainage, but also reports cough seems to have improved.  Denies known fevers.  States when she looked at her throat today it looked worse prompting her to come in today.  Overall continues to eat and drink well.  Has taken occasional Tylenol and ibuprofen but no other over-the-counter medications taken for the same complaints.  Denies recent sickness or recent antibiotic use.  Denies other aggravating or alleviating factors.  Denies known sick contacts.  Reports otherwise feels well.  Denies chest pain, shortness of breath, abdominal pain, dizziness, headaches, rash.  Patient reports that her blood pressure is normally 130s over 70s.  States that just prior to coming into urgent care she had a very frustrating experience with her primary doctor's office, and states that she knows this is why her blood pressure is elevated today and denies concerns.  States that she will continue to monitor her blood pressure and as it will come down.   Past Medical History:  Diagnosis Date  . Choledocholithiasis    history of  . Gallstone pancreatitis    history of  . Midsternal chest pain     Patient Active Problem List   Diagnosis Date Noted  . 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 Borderline blood pressure 10/27/2015  . H/O adenomatous polyp of colon 10/27/2015  . History of brain disorder 10/27/2015  . Migraine without aura and responsive to treatment 10/27/2015  . Sacroiliac sprain 10/27/2015  .  Midsternal chest pain 03/23/2012    Past Surgical History:  Procedure Laterality Date  . APPENDECTOMY    . COLONOSCOPY  2014   one tubular adenoma - rep 2019  . LAPAROSCOPIC CHOLECYSTECTOMY W/ CHOLANGIOGRAPHY  08/26/08   Dr Abbey Chatters, Dr Bosie Clos     No current facility-administered medications for this encounter.   Current Outpatient Medications:  .  aspirin 81 MG chewable tablet, Chew 1 tablet by mouth daily., Disp: , Rfl:  .  GARLIC, ALTERNATIVE MEDICINE - G'S,, Take by mouth., Disp: , Rfl:  .  Omega-3 Fatty Acids (FISH OIL BURP-LESS) 1200 MG CAPS, Take 2 capsules by mouth daily., Disp: , Rfl:  .  amoxicillin-clavulanate (AUGMENTIN) 875-125 MG tablet, Take 1 tablet by mouth every 12 (twelve) hours., Disp: 20 tablet, Rfl: 0  Allergies Latex  Family History  Problem Relation Age of Onset  . CAD Mother   . Migraines Sister   . Breast cancer Sister 37    Social History Social History   Tobacco Use  . Smoking status: Never Smoker  . Smokeless tobacco: Never Used  Substance Use Topics  . Alcohol use: No  . Drug use: No    Review of Systems Constitutional: No fever/chills. ENT: As above.  Cardiovascular: Denies chest pain. Respiratory: Denies shortness of breath. Gastrointestinal: No abdominal pain.   Musculoskeletal: Negative for back pain. Skin: Negative for rash.   ____________________________________________   PHYSICAL EXAM:  VITAL SIGNS: ED Triage Vitals  Enc Vitals Group     BP 07/13/18 1154 (!) 193/85     Pulse Rate  07/13/18 1154 80     Resp 07/13/18 1154 16     Temp 07/13/18 1154 98.2 F (36.8 C)     Temp Source 07/13/18 1154 Oral     SpO2 07/13/18 1154 100 %     Weight 07/13/18 1152 190 lb (86.2 kg)     Height 07/13/18 1152 5\' 4"  (1.626 m)     Head Circumference --      Peak Flow --      Pain Score 07/13/18 1152 2     Pain Loc --      Pain Edu? --      Excl. in GC? --    Vitals:   07/13/18 1152 07/13/18 1154 07/13/18 1231  BP:  (!)  193/85 (!) 192/83  Pulse:  80 71  Resp:  16   Temp:  98.2 F (36.8 C)   TempSrc:  Oral   SpO2:  100%   Weight: 190 lb (86.2 kg)    Height: 5\' 4"  (1.626 m)       Constitutional: Alert and oriented. Well appearing and in no acute distress. Eyes: Conjunctivae are normal.  Head: Atraumatic.Minimal tenderness to palpation bilateral frontal and maxillary sinuses. No swelling. No erythema.   Ears: no erythema, normal TMs bilaterally.   Nose: nasal congestion with bilateral nasal turbinate erythema and edema.   Mouth/Throat: Mucous membranes are [email or web address].pt to moderate pharyngeal erythema.  Mild bilateral tonsillar swelling.  No exudate.  Posterior pharyngeal drainage noted. Neck: No stridor.  No cervical spine tenderness to palpation. Hematological/Lymphatic/Immunilogical: No cervical lymphadenopathy. Cardiovascular: Normal rate, regular rhythm. Grossly normal heart sounds.  Good peripheral circulation. Respiratory: Normal respiratory effort.  No retractions. No wheezes, rales or rhonchi. Good air movement.  Musculoskeletal: No cervical, thoracic or lumbar tenderness to palpation.  Neurologic:  Normal speech and language. No gait instability. Skin:  Skin is warm, dry and intact. No rash noted. Psychiatric: Mood and affect are normal. Speech and behavior are normal.  ___________________________________________   LABS (all labs ordered are listed, but only abnormal results are displayed)  Labs Reviewed  RAPID STREP SCREEN ([facility] ONLY) - Abnormal; Notable for the following components:      Result Value   Streptococcus, Group A Screen (Direct) POSITIVE (*)    All other components within normal limits   ____________________________________________  PROCEDURES Procedures    INITIAL IMPRESSION / ASSESSMENT AND PLAN / ED COURSE  Pertinent labs & imaging results that were available during my care of the patient were reviewed by me and considered in my medical decision making (see  chart for details).  Well-appearing patient.  No acute distress.  Patient with sinus congestion sinus drainage.  Strep positive.  Will treat with oral Augmentin.  Patient denies need for cough medication.  Counseled and discuss strict letter regarding patient's blood pressure, dating she normally has good blood pressure we will continue to monitor at home.  Patient declines further evaluation of blood pressure at this time. Discussed indication, risks and benefits of medications with patient.  Discussed follow up with Primary care physician this week. Discussed follow up and return parameters including no resolution or any worsening concerns. Patient verbalized understanding and agreed to plan.   ____________________________________________   FINAL CLINICAL IMPRESSION(S) / ED DIAGNOSES  Final diagnoses:  Strep pharyngitis  Acute maxillary sinusitis, recurrence not specified  Elevated blood pressure reading     ED Discharge Orders        Ordered    amoxicillin-clavulanate (AUGMENTIN) 875-125 MG  tablet  Every 12 hours,   Status:  Discontinued     07/13/18 1222       Note: This dictation was prepared with Dragon dictation along with smaller phrase technology. Any transcriptional errors that result from this process are unintentional.         Renford DillsMiller, Diem Pagnotta, NP 07/13/18 1251

## 2018-07-13 NOTE — Discharge Instructions (Addendum)
Take medication as prescribed. Rest. Drink plenty of fluids.  ° °Follow up with your primary care physician this week as needed. Return to Urgent care for new or worsening concerns.  ° °

## 2019-07-03 ENCOUNTER — Other Ambulatory Visit: Payer: Self-pay | Admitting: Obstetrics & Gynecology

## 2019-07-03 DIAGNOSIS — Z1231 Encounter for screening mammogram for malignant neoplasm of breast: Secondary | ICD-10-CM

## 2019-07-12 ENCOUNTER — Encounter (INDEPENDENT_AMBULATORY_CARE_PROVIDER_SITE_OTHER): Payer: Self-pay

## 2019-07-12 ENCOUNTER — Other Ambulatory Visit: Payer: Self-pay

## 2019-07-12 ENCOUNTER — Ambulatory Visit
Admission: RE | Admit: 2019-07-12 | Discharge: 2019-07-12 | Disposition: A | Discharge status: Home / Self Care | Payer: BC Managed Care – PPO | Source: Ambulatory Visit | Attending: Obstetrics & Gynecology | Admitting: Obstetrics & Gynecology

## 2019-07-12 DIAGNOSIS — Z1231 Encounter for screening mammogram for malignant neoplasm of breast: Secondary | ICD-10-CM | POA: Insufficient documentation

## 2019-07-16 ENCOUNTER — Other Ambulatory Visit: Payer: Self-pay | Admitting: Obstetrics & Gynecology

## 2019-07-16 DIAGNOSIS — N6489 Other specified disorders of breast: Secondary | ICD-10-CM

## 2019-07-16 DIAGNOSIS — R928 Other abnormal and inconclusive findings on diagnostic imaging of breast: Secondary | ICD-10-CM

## 2019-07-25 ENCOUNTER — Ambulatory Visit
Admission: RE | Admit: 2019-07-25 | Discharge: 2019-07-25 | Disposition: A | Discharge status: Home / Self Care | Payer: BC Managed Care – PPO | Source: Ambulatory Visit | Attending: Obstetrics & Gynecology | Admitting: Obstetrics & Gynecology

## 2019-07-25 DIAGNOSIS — N6489 Other specified disorders of breast: Secondary | ICD-10-CM

## 2019-07-25 DIAGNOSIS — R928 Other abnormal and inconclusive findings on diagnostic imaging of breast: Secondary | ICD-10-CM

## 2020-03-20 IMAGING — MG DIGITAL DIAGNOSTIC UNILATERAL RIGHT MAMMOGRAM WITH TOMO AND CAD
4 series · 4 of 12 positions shown · non-contrast
Comparison: Previous exam(s).

CLINICAL DATA: Right breast lower inner quadrant focal asymmetry
seen on most recent screening mammography.

EXAM:
DIGITAL DIAGNOSTIC RIGHT MAMMOGRAM WITH CAD AND TOMO
ULTRASOUND RIGHT BREAST

[R MLO synth-2D]
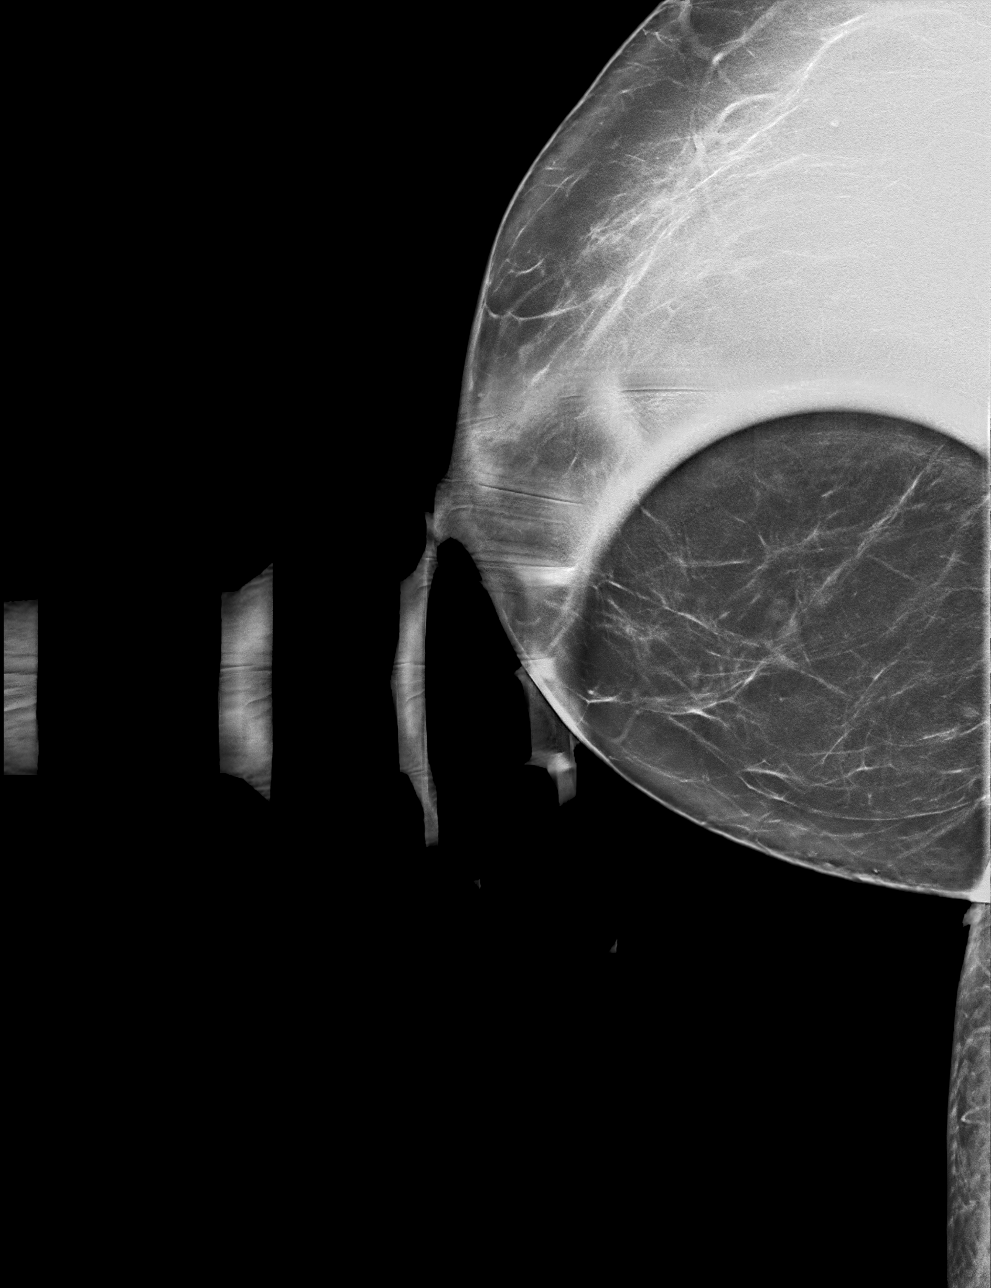

[R CC synth-2D]
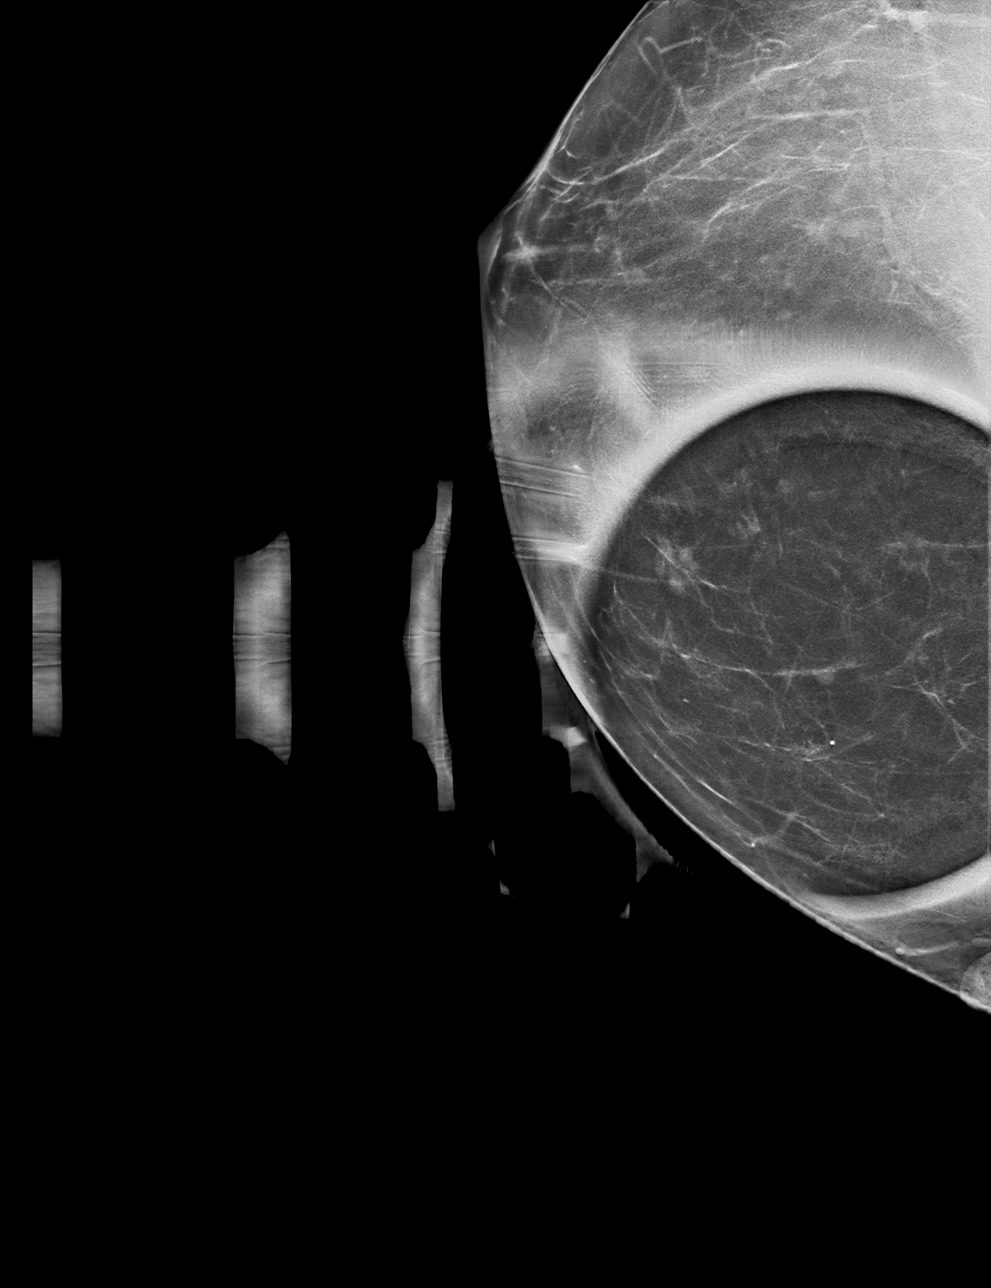

[R MLO tomo · tomo slice 36/71.0]
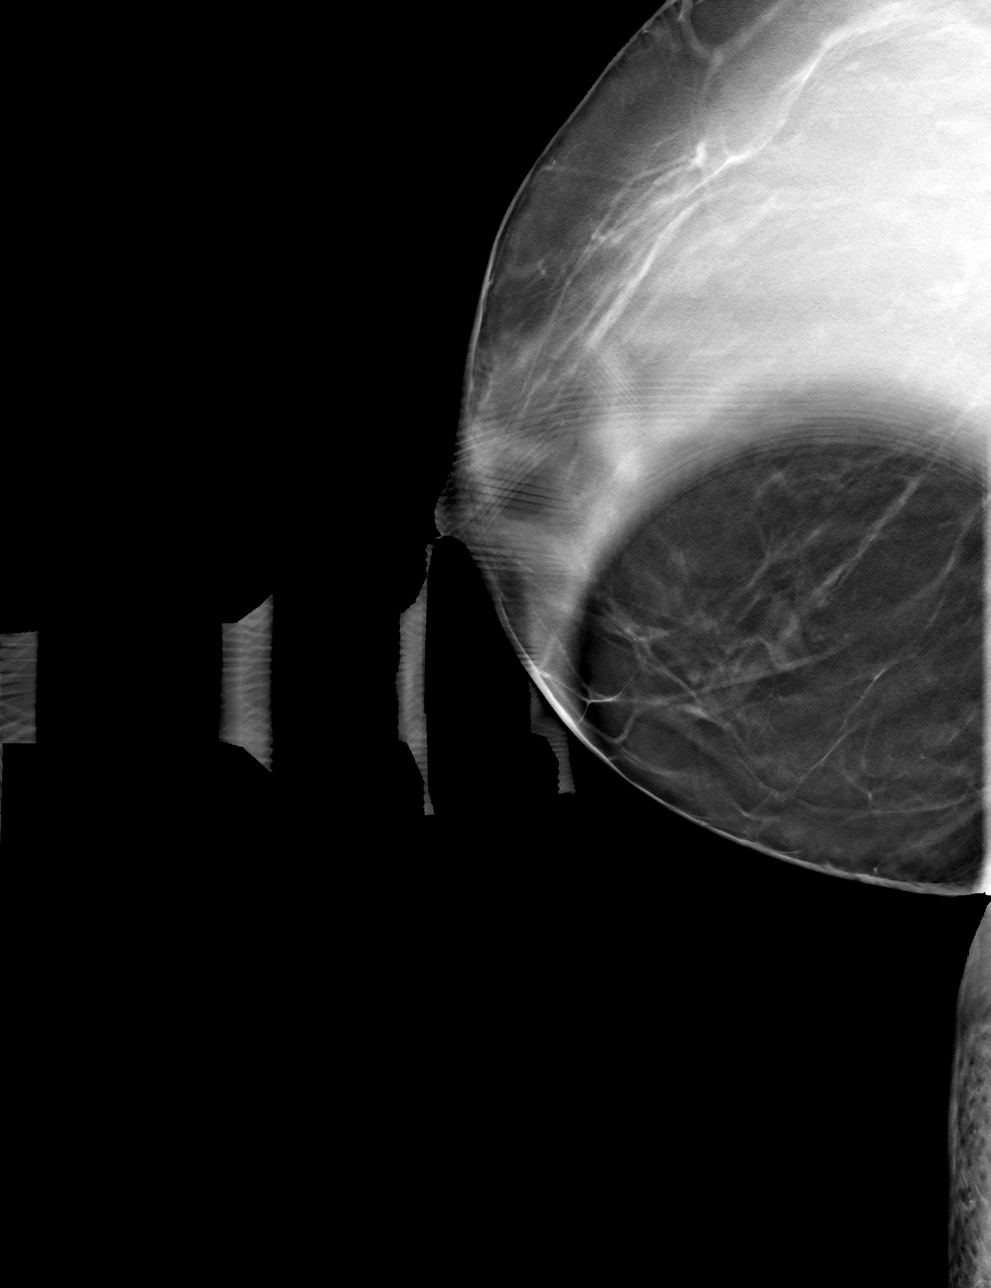

[R CC tomo · tomo slice 31/61.0]
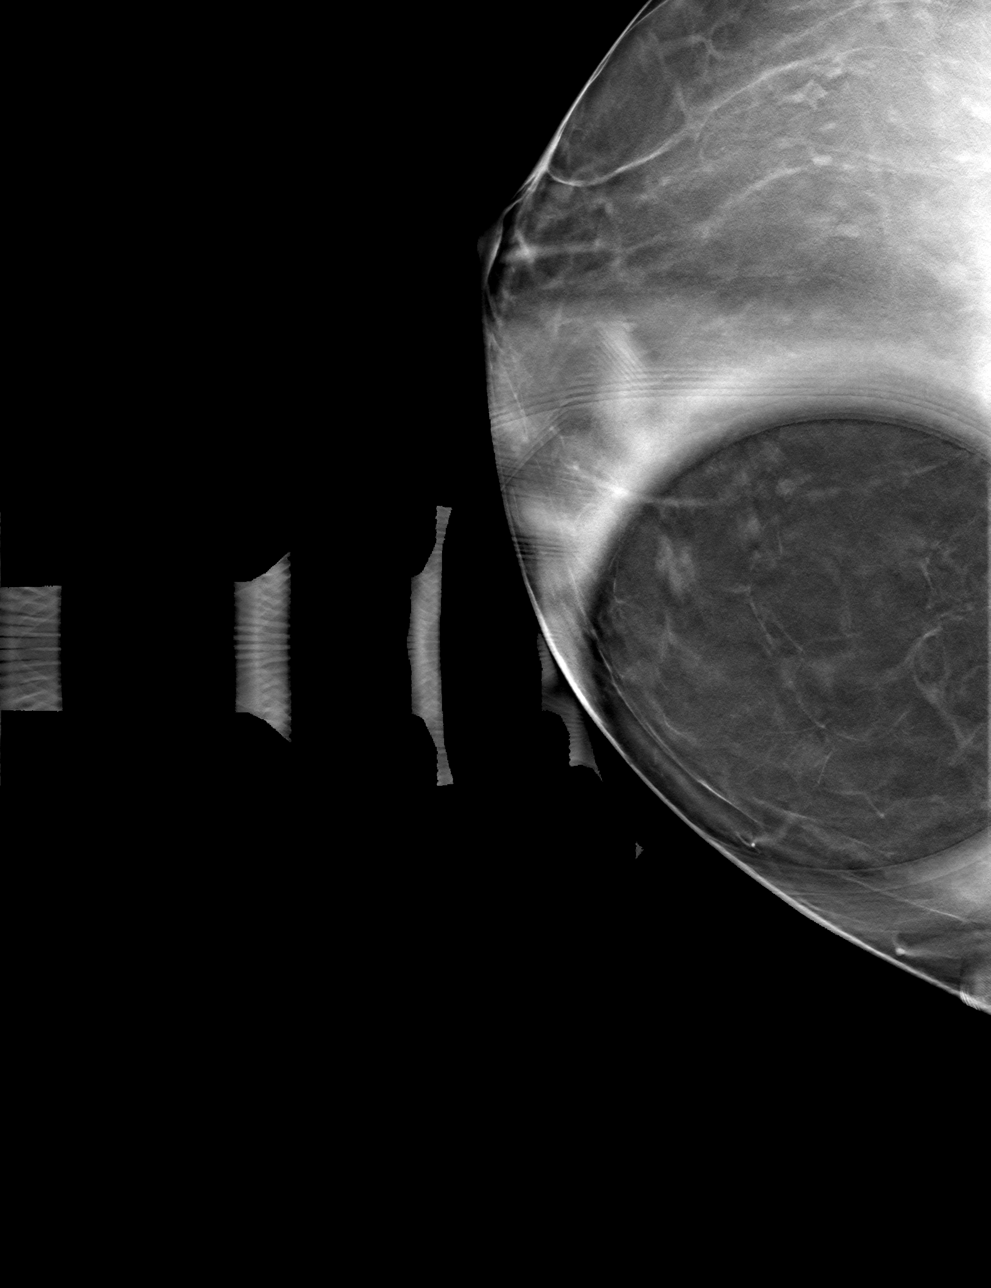

[4 of 12 positions shown; findings below may reference images not displayed]

ACR Breast Density Category b: There are scattered areas of
fibroglandular density.
FINDINGS: Additional mammographic views of the right breast demonstrate a
multiloculated circumscribed mass in the right breast lower inner
quadrant, middle depth. The abnormality measures 9 mm
mammographically.

Mammographic images were processed with CAD.

On physical exam, no suspicious masses are palpated.

Targeted ultrasound is performed, showing right breast 4 o'clock 3
cm from the nipple cluster of benign-appearing cysts, measuring
individually 3-4 mm. This finding corresponds to the
mammographically seen mass.
IMPRESSION: No mammographic or sonographic evidence of right breast malignancy.

Cluster of benign-appearing right breast 4 o'clock cysts.

RECOMMENDATION:
Screening mammogram in one year.(Code:2H-[id number])

I have discussed the findings and recommendations with the patient.
Results were also provided in writing at the conclusion of the
visit. If applicable, a reminder letter will be sent to the patient
regarding the next appointment.

BI-RADS CATEGORY  2: Benign.
